# Patient Record
Sex: Female | Born: 1955 | Race: White | Hispanic: No | Marital: Married | Smoking: Former smoker
Health system: Southern US, Community
[De-identification: ages and names within clinical notes are randomized; demographics above are authoritative.]

## PROBLEM LIST (undated history)

## (undated) DIAGNOSIS — E785 Hyperlipidemia, unspecified: Secondary | ICD-10-CM

## (undated) DIAGNOSIS — I10 Essential (primary) hypertension: Secondary | ICD-10-CM

## (undated) DIAGNOSIS — K219 Gastro-esophageal reflux disease without esophagitis: Secondary | ICD-10-CM

## (undated) HISTORY — DX: Hyperlipidemia, unspecified: E78.5

## (undated) HISTORY — PX: ABDOMINAL HYSTERECTOMY: SHX81

## (undated) HISTORY — PX: TONSILLECTOMY AND ADENOIDECTOMY: SHX28

## (undated) HISTORY — DX: Essential (primary) hypertension: I10

## (undated) HISTORY — DX: Gastro-esophageal reflux disease without esophagitis: K21.9

---

## 2005-08-19 ENCOUNTER — Ambulatory Visit: Payer: Self-pay | Admitting: Cardiology

## 2019-04-05 DIAGNOSIS — Z0001 Encounter for general adult medical examination with abnormal findings: Secondary | ICD-10-CM | POA: Diagnosis not present

## 2019-04-05 DIAGNOSIS — R300 Dysuria: Secondary | ICD-10-CM | POA: Diagnosis not present

## 2019-04-10 DIAGNOSIS — N951 Menopausal and female climacteric states: Secondary | ICD-10-CM | POA: Diagnosis not present

## 2019-04-10 DIAGNOSIS — Z23 Encounter for immunization: Secondary | ICD-10-CM | POA: Diagnosis not present

## 2019-04-10 DIAGNOSIS — Z6828 Body mass index (BMI) 28.0-28.9, adult: Secondary | ICD-10-CM | POA: Diagnosis not present

## 2019-04-10 DIAGNOSIS — I10 Essential (primary) hypertension: Secondary | ICD-10-CM | POA: Diagnosis not present

## 2019-04-10 DIAGNOSIS — R0600 Dyspnea, unspecified: Secondary | ICD-10-CM | POA: Diagnosis not present

## 2019-04-10 DIAGNOSIS — Z0000 Encounter for general adult medical examination without abnormal findings: Secondary | ICD-10-CM | POA: Diagnosis not present

## 2019-04-10 DIAGNOSIS — K219 Gastro-esophageal reflux disease without esophagitis: Secondary | ICD-10-CM | POA: Diagnosis not present

## 2019-05-07 ENCOUNTER — Other Ambulatory Visit: Payer: Self-pay

## 2019-05-07 DIAGNOSIS — Z20822 Contact with and (suspected) exposure to covid-19: Secondary | ICD-10-CM

## 2019-05-11 LAB — NOVEL CORONAVIRUS, NAA: SARS-CoV-2, NAA: DETECTED — AB

## 2019-06-12 ENCOUNTER — Other Ambulatory Visit: Payer: Self-pay

## 2019-12-13 DIAGNOSIS — E7849 Other hyperlipidemia: Secondary | ICD-10-CM | POA: Diagnosis not present

## 2019-12-13 DIAGNOSIS — I10 Essential (primary) hypertension: Secondary | ICD-10-CM | POA: Diagnosis not present

## 2019-12-20 DIAGNOSIS — M170 Bilateral primary osteoarthritis of knee: Secondary | ICD-10-CM | POA: Diagnosis not present

## 2019-12-20 DIAGNOSIS — M1712 Unilateral primary osteoarthritis, left knee: Secondary | ICD-10-CM | POA: Diagnosis not present

## 2019-12-20 DIAGNOSIS — M25562 Pain in left knee: Secondary | ICD-10-CM | POA: Diagnosis not present

## 2019-12-20 DIAGNOSIS — M1711 Unilateral primary osteoarthritis, right knee: Secondary | ICD-10-CM | POA: Diagnosis not present

## 2019-12-20 DIAGNOSIS — M25561 Pain in right knee: Secondary | ICD-10-CM | POA: Diagnosis not present

## 2020-02-12 DIAGNOSIS — E7849 Other hyperlipidemia: Secondary | ICD-10-CM | POA: Diagnosis not present

## 2020-02-12 DIAGNOSIS — I10 Essential (primary) hypertension: Secondary | ICD-10-CM | POA: Diagnosis not present

## 2020-03-13 DIAGNOSIS — K219 Gastro-esophageal reflux disease without esophagitis: Secondary | ICD-10-CM | POA: Diagnosis not present

## 2020-03-13 DIAGNOSIS — I10 Essential (primary) hypertension: Secondary | ICD-10-CM | POA: Diagnosis not present

## 2020-05-13 DIAGNOSIS — K219 Gastro-esophageal reflux disease without esophagitis: Secondary | ICD-10-CM | POA: Diagnosis not present

## 2020-05-13 DIAGNOSIS — R600 Localized edema: Secondary | ICD-10-CM | POA: Diagnosis not present

## 2020-05-13 DIAGNOSIS — I10 Essential (primary) hypertension: Secondary | ICD-10-CM | POA: Diagnosis not present

## 2020-06-15 DIAGNOSIS — Z87891 Personal history of nicotine dependence: Secondary | ICD-10-CM | POA: Diagnosis not present

## 2020-06-15 DIAGNOSIS — R0600 Dyspnea, unspecified: Secondary | ICD-10-CM | POA: Diagnosis not present

## 2020-06-15 DIAGNOSIS — Z6829 Body mass index (BMI) 29.0-29.9, adult: Secondary | ICD-10-CM | POA: Diagnosis not present

## 2020-06-23 DIAGNOSIS — Z20828 Contact with and (suspected) exposure to other viral communicable diseases: Secondary | ICD-10-CM | POA: Diagnosis not present

## 2020-07-07 ENCOUNTER — Other Ambulatory Visit: Payer: Self-pay

## 2020-07-07 ENCOUNTER — Encounter: Payer: Self-pay | Admitting: Internal Medicine

## 2020-07-07 ENCOUNTER — Other Ambulatory Visit (HOSPITAL_COMMUNITY)
Admission: RE | Admit: 2020-07-07 | Discharge: 2020-07-07 | Disposition: A | Discharge status: Home / Self Care | Payer: PPO | Source: Ambulatory Visit | Attending: Internal Medicine | Admitting: Internal Medicine

## 2020-07-07 ENCOUNTER — Ambulatory Visit (HOSPITAL_COMMUNITY)
Admission: RE | Admit: 2020-07-07 | Discharge: 2020-07-07 | Disposition: A | Discharge status: Home / Self Care | Payer: PPO | Source: Ambulatory Visit | Attending: Internal Medicine | Admitting: Internal Medicine

## 2020-07-07 ENCOUNTER — Ambulatory Visit: Payer: PPO | Admitting: Internal Medicine

## 2020-07-07 DIAGNOSIS — R0600 Dyspnea, unspecified: Secondary | ICD-10-CM | POA: Insufficient documentation

## 2020-07-07 DIAGNOSIS — R0609 Other forms of dyspnea: Secondary | ICD-10-CM

## 2020-07-07 DIAGNOSIS — R0602 Shortness of breath: Secondary | ICD-10-CM | POA: Diagnosis not present

## 2020-07-07 DIAGNOSIS — K449 Diaphragmatic hernia without obstruction or gangrene: Secondary | ICD-10-CM | POA: Diagnosis not present

## 2020-07-07 LAB — CBC WITH DIFFERENTIAL/PLATELET
Abs Immature Granulocytes: 0.02 10*3/uL (ref 0.00–0.07)
Basophils Absolute: 0.1 10*3/uL (ref 0.0–0.1)
Basophils Relative: 1 %
Eosinophils Absolute: 0.2 10*3/uL (ref 0.0–0.5)
Eosinophils Relative: 3 %
HCT: 41.6 % (ref 36.0–46.0)
Hemoglobin: 13.4 g/dL (ref 12.0–15.0)
Immature Granulocytes: 0 %
Lymphocytes Relative: 28 %
Lymphs Abs: 1.6 10*3/uL (ref 0.7–4.0)
MCH: 32.1 pg (ref 26.0–34.0)
MCHC: 32.2 g/dL (ref 30.0–36.0)
MCV: 99.8 fL (ref 80.0–100.0)
Monocytes Absolute: 0.7 10*3/uL (ref 0.1–1.0)
Monocytes Relative: 12 %
Neutro Abs: 3.1 10*3/uL (ref 1.7–7.7)
Neutrophils Relative %: 56 %
Platelets: 278 10*3/uL (ref 150–400)
RBC: 4.17 MIL/uL (ref 3.87–5.11)
RDW: 12.1 % (ref 11.5–15.5)
WBC: 5.6 10*3/uL (ref 4.0–10.5)
nRBC: 0 % (ref 0.0–0.2)

## 2020-07-07 LAB — BASIC METABOLIC PANEL
Anion gap: 9 (ref 5–15)
BUN: 17 mg/dL (ref 8–23)
CO2: 27 mmol/L (ref 22–32)
Calcium: 9.3 mg/dL (ref 8.9–10.3)
Chloride: 101 mmol/L (ref 98–111)
Creatinine, Ser: 1 mg/dL (ref 0.44–1.00)
GFR calc Af Amer: >60 mL/min (ref >60)
GFR calc non Af Amer: 59 mL/min — ABNORMAL LOW (ref >60)
Glucose, Bld: 105 mg/dL — ABNORMAL HIGH (ref 70–99)
Potassium: 4.2 mmol/L (ref 3.5–5.1)
Sodium: 137 mmol/L (ref 135–145)

## 2020-07-07 LAB — D-DIMER, QUANTITATIVE: D-Dimer, Quant: 0.87 ug/mL-FEU — ABNORMAL HIGH (ref 0.00–0.50)

## 2020-07-07 LAB — TSH: TSH: 2.007 u[IU]/mL (ref 0.350–4.500)

## 2020-07-07 LAB — BRAIN NATRIURETIC PEPTIDE: B Natriuretic Peptide: 99 pg/mL (ref 0.0–100.0)

## 2020-07-07 MED ORDER — PANTOPRAZOLE SODIUM 40 MG PO TBEC
DELAYED_RELEASE_TABLET | ORAL | 11 refills | Status: DC
Start: 2020-07-07 — End: 2022-10-05

## 2020-07-07 NOTE — Assessment & Plan Note (Addendum)
Onset 2019 assoc with 15 lb wt gain/ still playing pickle ball as of 07/07/2020  With overt gerd / noct cough  - 07/07/2020 rec max gerd rx/ reconditioning by submax ex then proceed to cpst   Symptoms are  disproportionate to objective findings and not clear to what extent this is actually a pulmonary  problem but pt does appear to have difficult to sort out respiratory symptoms of unknown origin for which  DDX  = almost all start with A and  include Adherence, Ace Inhibitors, Acid Reflux, Active Sinus Disease, Alpha 1 Antitripsin deficiency, Anxiety masquerading as Airways dz,  ABPA,  Allergy(esp in young), Aspiration (esp in elderly), Adverse effects of meds,  Active smoking or Vaping, A bunch of PE's/clot burden (a few small clots can't cause this syndrome unless there is already severe underlying pulm or vascular dz with poor reserve),  Anemia or thyroid disorder, plus two Bs  = Bronchiectasis and Beta blocker use..and one C= CHF    Adherence is always the initial "prime suspect" and is a multilayered concern that requires a "trust but verify" approach in every patient - starting with knowing how to use medications, especially inhalers, correctly, keeping up with refills and understanding the fundamental difference between maintenance and prns vs those medications only taken for a very short course and then stopped and not refilled.   ? Acid (or non-acid) GERD > always difficult to exclude as up to 75% of pts in some series report no assoc GI/ Heartburn symptoms> rec max (24h)  acid suppression and diet restrictions/ reviewed and instructions given in writing.   ? Allergy / asthma > check allergy profile, hold off bronchodilators until after cpst at which time will get before and after spriometry to r/o EIA   ? Adverse drug effects > none of the usual suspects listed  ? ABPA > check eos/ IgE  ? Alpha one AT > check level  ? Anemia/ thyroid dz> ruled out today  ? A bunch of PEs>  D dimer high   nl -  high normal value (seen commonly in the elderly or chronically ill)  may miss small peripheral pe, the clot burden with sob is moderately high and the d dimer  has a very high neg pred value if used in this setting.    ? Beta blocker effects > unlikely  on such low doses lopressor  ? chf > excluded by cxr/ bnp    >>> will focus on rx of reflux / advise on reconditioning and then cpst next if not making progress and in meantime advised: Make sure you check your oxygen saturations at highest level of activity to be sure it stays over 90% and keep track of it at least once a week, more often if breathing getting worse, and let me know if losing ground.           Each maintenance medication was reviewed in detail including emphasizing most importantly the difference between maintenance and prns and under what circumstances the prns are to be triggered using an action plan format where appropriate.  Total time for H and P, chart review, counseling,  and generating customized AVS unique to this office visit / charting = 68 min

## 2020-07-07 NOTE — Patient Instructions (Addendum)
To get the most out of exercise, you need to be continuously aware that you are short of breath, but never out of breath, for 30 minutes daily. As you improve, it will actually be easier for you to do the same amount of exercise  in  30 minutes so always push to the level where you are short of breath.  Make sure you check your oxygen saturations at highest level of activity to be sure it stays over 90% and keep track of it at least once a week, more often if breathing getting worse, and let me know if losing ground.   Protonix 40 mg Take 30- 60 min before your first and last meals of the day    GERD (REFLUX)  is an extremely common cause of respiratory symptoms just like yours , many times with no obvious heartburn at all.    It can be treated with medication, but also with lifestyle changes including elevation of the head of your bed (ideally with 6 -8inch blocks under the headboard of your bed),  Smoking cessation, avoidance of late meals, excessive alcohol, and avoid fatty foods, chocolate, peppermint, colas, red wine, and acidic juices such as orange juice.  NO MINT OR MENTHOL PRODUCTS SO NO COUGH DROPS  USE SUGARLESS CANDY INSTEAD (Jolley ranchers or Stover's or Life Savers) or even ice chips will also do - the key is to swallow to prevent all throat clearing. NO OIL BASED VITAMINS - use powdered substitutes.  Avoid fish oil when coughing.   Please remember to go to the lab and x-ray department at [facility]   for your tests - we will call you with the results when they are available.   If not better after several weeks doing the above then  call me to schedule CPST.       Marland Kitchen

## 2020-07-07 NOTE — Progress Notes (Signed)
Kelly Valentine, female    DOB: September 06, 1956, 64 y.o.   MRN: [id number]   Brief patient profile:  89 yowf  Quit smoking 2006 did great with IUP's and good ex tolerance until 2019 rx with prednisone for R hip pain, resolved p epidural but this time period developed doe   assoc with wt gain from a baseline 165 and downhill since so referred to pulmonary clinic in [facility]  07/07/2020 by Alyssa Allwardt.   W/u :  Stress test on treadmill 2019 by [facility] reportedly neg for cardiac cause for doe     History of Present Illness  07/07/2020  Pulmonary/ 1st office eval/ Kelly Valentine / [facility]  Chief Complaint  Patient presents with  . Pulmonary Consult    Referred by Ila Mcgill, PA.  Pt c/o SOB for the past 2 years- progressively getting worse. She states SOB is "kind of constant"- worse with exertion such as carrying her grandson.   Dyspnea:  Can't do [employer] fast x 3  /  One flight steps and stops at top  Still pickle ball and dancing  Cough: variably esp at hs dry hack at bedtime but does not keep her  Sleep: one pillow / bed is flat  SABA use: none Has overt hb despite ppi in am and pepcid in pm   No obvious day to day or daytime variability or assoc excess/ purulent sputum or mucus plugs or hemoptysis or cp or chest tightness, subjective wheeze or overt sinus  symptoms.   Sleeping once she gets to sleep  without nocturnal  or early am exacerbation  of respiratory  c/o's or need for noct saba. Also denies any obvious fluctuation of symptoms with weather or environmental changes or other aggravating or alleviating factors except as outlined above   No unusual exposure hx or h/o childhood pna/ asthma or knowledge of premature birth.  Current Allergies, Complete Past Medical History, Past Surgical History, Family History, and Social History were reviewed in [employer] record.  ROS  The following are not active complaints unless bolded Hoarseness,  sore throat, dysphagia, dental problems, itching, sneezing,  nasal congestion or discharge of excess mucus or purulent secretions, ear ache,   fever, chills, sweats, unintended wt loss or wt gain, classically pleuritic or exertional cp,  orthopnea pnd or arm/hand swelling  or leg swelling, presyncope, palpitations, abdominal pain, anorexia, nausea, vomiting, diarrhea  or change in bowel habits or change in bladder habits, change in stools or change in urine, dysuria, hematuria,  rash, arthralgias, visual complaints, headache, numbness, weakness or ataxia or problems with walking or coordination,  change in mood or  memory.           No past medical history on file.  Outpatient Medications Prior to Visit  Medication Sig Dispense Refill  . metoprolol tartrate (LOPRESSOR) 50 MG tablet Take 50 mg by mouth daily.    . pantoprazole (PROTONIX) 40 MG tablet Take 40 mg by mouth daily.         Objective:     BP (!) 148/90 (BP Location: Left Arm, Cuff Size: Normal)   Pulse 66   Temp 97.8 F (36.6 C) (Temporal)   Ht 5\' 7"  (1.702 m)   Wt 181 lb (82.1 kg)   SpO2 97% Comment: on RA  BMI 28.35 kg/m   SpO2: 97 % (on RA)   amb pleasant wf nad    HEENT : pt wearing mask not removed for exam due  to covid -19 concerns.    NECK :  without JVD/Nodes/TM/ nl carotid upstrokes bilaterally   LUNGS: no acc muscle use,  Mild  contour chest which is clear to A and P bilaterally without cough on insp or exp maneuvers   CV:  RRR  no s3 or murmur or increase in P2, and no edema   ABD:  soft and nontender with nl inspiratory excursion in the supine position. No bruits or organomegaly appreciated, bowel sounds nl  MS:  Nl gait/ ext warm without deformities, calf tenderness, cyanosis or clubbing No obvious joint restrictions   SKIN: warm and dry without lesions    NEURO:  alert, approp, nl sensorium with  no motor or cerebellar deficits apparent.    CXR PA and Lateral:   07/07/2020 :    I personally  reviewed images and   impression as follows:    very mild copd/ mild kyphosis/ HH   Labs ordered 07/07/2020  :  allergy profile   alpha one AT phenotype    Labs ordered/ reviewed:      Chemistry      Component Value Date/Time   NA 137 07/07/2020 1148   K 4.2 07/07/2020 1148   CL 101 07/07/2020 1148   CO2 27 07/07/2020 1148   BUN 17 07/07/2020 1148   CREATININE 1.00 07/07/2020 1148      Component Value Date/Time   CALCIUM 9.3 07/07/2020 1148        Lab Results  Component Value Date   WBC 5.6 07/07/2020   HGB 13.4 07/07/2020   HCT 41.6 07/07/2020   MCV 99.8 07/07/2020   PLT 278 07/07/2020       EOS                                                               0.2                                   07/07/2020   Lab Results  Component Value Date   DDIMER 0.87 (H) 07/07/2020      Lab Results  Component Value Date   TSH 2.007 07/07/2020      BNP   07/07/2020   =  99            Assessment   DOE (dyspnea on exertion) Onset 2019 assoc with 15 lb wt gain/ still playing pickle ball as of 07/07/2020  With overt gerd / noct cough  - 07/07/2020 rec max gerd rx/ reconditioning by submax ex then proceed to cpst   Symptoms are  disproportionate to objective findings and not clear to what extent this is actually a pulmonary  problem but pt does appear to have difficult to sort out respiratory symptoms of unknown origin for which  DDX  = almost all start with A and  include Adherence, Ace Inhibitors, Acid Reflux, Active Sinus Disease, Alpha 1 Antitripsin deficiency, Anxiety masquerading as Airways dz,  ABPA,  Allergy(esp in young), Aspiration (esp in elderly), Adverse effects of meds,  Active smoking or Vaping, A bunch of PE's/clot burden (a few small clots can't cause this syndrome unless there is already severe underlying pulm or vascular dz with poor  reserve),  Anemia or thyroid disorder, plus two Bs  = Bronchiectasis and Beta blocker use..and one C= CHF    Adherence is always  the initial "prime suspect" and is a multilayered concern that requires a "trust but verify" approach in every patient - starting with knowing how to use medications, especially inhalers, correctly, keeping up with refills and understanding the fundamental difference between maintenance and prns vs those medications only taken for a very short course and then stopped and not refilled.   ? Acid (or non-acid) GERD > always difficult to exclude as up to 75% of pts in some series report no assoc GI/ Heartburn symptoms> rec max (24h)  acid suppression and diet restrictions/ reviewed and instructions given in writing.   ? Allergy / asthma > check allergy profile, hold off bronchodilators until after cpst at which time will get before and after spriometry to r/o EIA   ? Adverse drug effects > none of the usual suspects listed  ? ABPA > check eos/ IgE  ? Alpha one AT > check level  ? Anemia/ thyroid dz> ruled out today  ? A bunch of PEs>  D dimer high  nl -  high normal value (seen commonly in the elderly or chronically ill)  may miss small peripheral pe, the clot burden with sob is moderately high and the d dimer  has a very high neg pred value if used in this setting.    ? Beta blocker effects > unlikely  on such low doses lopressor  ? chf > excluded by cxr/ bnp    >>> will focus on rx of reflux / advise on reconditioning and then cpst next if not making progress and in meantime advised: Make sure you check your oxygen saturations at highest level of activity to be sure it stays over 90% and keep track of it at least once a week, more often if breathing getting worse, and let me know if losing ground.           Each maintenance medication was reviewed in detail including emphasizing most importantly the difference between maintenance and prns and under what circumstances the prns are to be triggered using an action plan format where appropriate.  Total time for H and P, chart review,  counseling,  and generating customized AVS unique to this office visit / charting = 68 min              Sandrea Hughs, MD 07/07/2020

## 2020-07-08 ENCOUNTER — Encounter: Payer: Self-pay | Admitting: Internal Medicine

## 2020-07-08 NOTE — Progress Notes (Signed)
Spoke with pt and notified of results per Dr. Wert. Pt verbalized understanding and denied any questions. 

## 2020-07-09 LAB — ALPHA-1 ANTITRYPSIN PHENOTYPE: A-1 Antitrypsin, Ser: 133 mg/dL (ref 101–187)

## 2020-07-14 DIAGNOSIS — K219 Gastro-esophageal reflux disease without esophagitis: Secondary | ICD-10-CM | POA: Diagnosis not present

## 2020-07-14 DIAGNOSIS — R600 Localized edema: Secondary | ICD-10-CM | POA: Diagnosis not present

## 2020-07-14 DIAGNOSIS — I10 Essential (primary) hypertension: Secondary | ICD-10-CM | POA: Diagnosis not present

## 2020-08-05 ENCOUNTER — Telehealth: Payer: Self-pay | Admitting: Internal Medicine

## 2020-08-05 NOTE — Telephone Encounter (Signed)
Spoke with the pt  She is aware of results  She will call back after her vacation if wants to proceed with stress test  She states wants to continue ov recs longer to see if she improves

## 2020-08-05 NOTE — Telephone Encounter (Signed)
Sorry for the delay but they came back piecemeal and I hadn't realized the last one had finally arrived - they are all nl so if not feeling better will need to consider cpst next

## 2020-08-05 NOTE — Telephone Encounter (Signed)
Called and spoke with patient, she is asking for lab results from 07/07/20.  She states she received a call about her cxr, but not about her lab work.  I let her know that I would have to get Dr. Sherene Sires to review the labs and then we would give her a call back.  She verbalized understanding. Dr. Sherene Sires, please advise on patients labs from 07/07/2020.  Thank you.

## 2020-08-11 DIAGNOSIS — K219 Gastro-esophageal reflux disease without esophagitis: Secondary | ICD-10-CM | POA: Diagnosis not present

## 2020-08-11 DIAGNOSIS — Z13220 Encounter for screening for lipoid disorders: Secondary | ICD-10-CM | POA: Diagnosis not present

## 2020-08-11 DIAGNOSIS — Z0001 Encounter for general adult medical examination with abnormal findings: Secondary | ICD-10-CM | POA: Diagnosis not present

## 2020-08-11 DIAGNOSIS — I10 Essential (primary) hypertension: Secondary | ICD-10-CM | POA: Diagnosis not present

## 2020-08-13 DIAGNOSIS — Z6829 Body mass index (BMI) 29.0-29.9, adult: Secondary | ICD-10-CM | POA: Diagnosis not present

## 2020-08-13 DIAGNOSIS — N951 Menopausal and female climacteric states: Secondary | ICD-10-CM | POA: Diagnosis not present

## 2020-08-13 DIAGNOSIS — K219 Gastro-esophageal reflux disease without esophagitis: Secondary | ICD-10-CM | POA: Diagnosis not present

## 2020-08-13 DIAGNOSIS — Z0000 Encounter for general adult medical examination without abnormal findings: Secondary | ICD-10-CM | POA: Diagnosis not present

## 2020-08-13 DIAGNOSIS — M1710 Unilateral primary osteoarthritis, unspecified knee: Secondary | ICD-10-CM | POA: Diagnosis not present

## 2020-08-13 DIAGNOSIS — I10 Essential (primary) hypertension: Secondary | ICD-10-CM | POA: Diagnosis not present

## 2020-08-13 DIAGNOSIS — Z23 Encounter for immunization: Secondary | ICD-10-CM | POA: Diagnosis not present

## 2020-09-12 DIAGNOSIS — K219 Gastro-esophageal reflux disease without esophagitis: Secondary | ICD-10-CM | POA: Diagnosis not present

## 2020-09-12 DIAGNOSIS — R600 Localized edema: Secondary | ICD-10-CM | POA: Diagnosis not present

## 2020-09-12 DIAGNOSIS — I10 Essential (primary) hypertension: Secondary | ICD-10-CM | POA: Diagnosis not present

## 2020-10-13 DIAGNOSIS — I10 Essential (primary) hypertension: Secondary | ICD-10-CM | POA: Diagnosis not present

## 2020-10-13 DIAGNOSIS — K219 Gastro-esophageal reflux disease without esophagitis: Secondary | ICD-10-CM | POA: Diagnosis not present

## 2020-10-13 DIAGNOSIS — R600 Localized edema: Secondary | ICD-10-CM | POA: Diagnosis not present

## 2020-11-13 DIAGNOSIS — R600 Localized edema: Secondary | ICD-10-CM | POA: Diagnosis not present

## 2020-11-13 DIAGNOSIS — I10 Essential (primary) hypertension: Secondary | ICD-10-CM | POA: Diagnosis not present

## 2020-11-13 DIAGNOSIS — K219 Gastro-esophageal reflux disease without esophagitis: Secondary | ICD-10-CM | POA: Diagnosis not present

## 2020-12-10 IMAGING — DX DG CHEST 2V
2 series · 2 of 2 positions shown · non-contrast
Comparison: None.

CLINICAL DATA: Shortness of breath.  Positive COVID test 05/11/2019

EXAM:
CHEST - 2 VIEW

[chest pa]
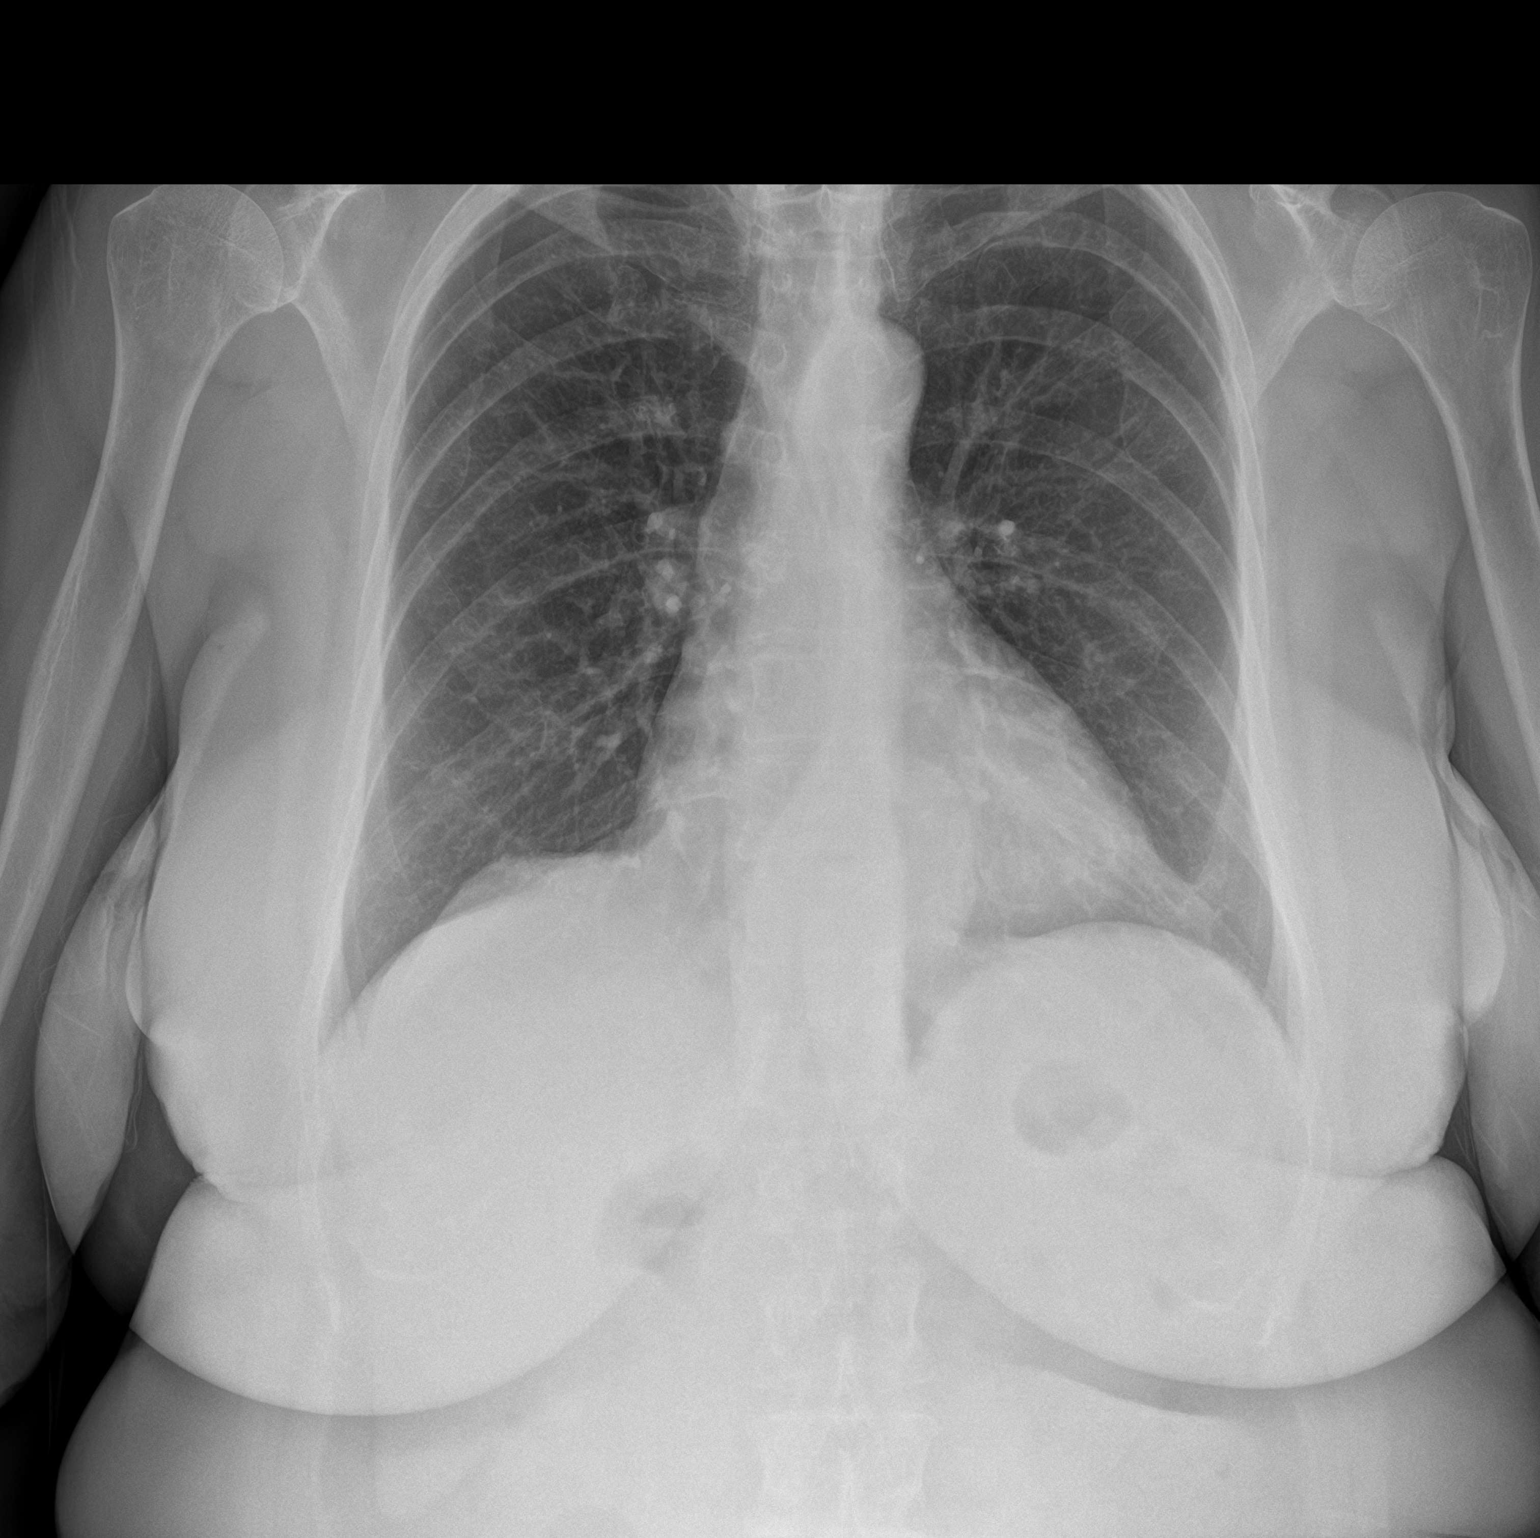

[chest lat]
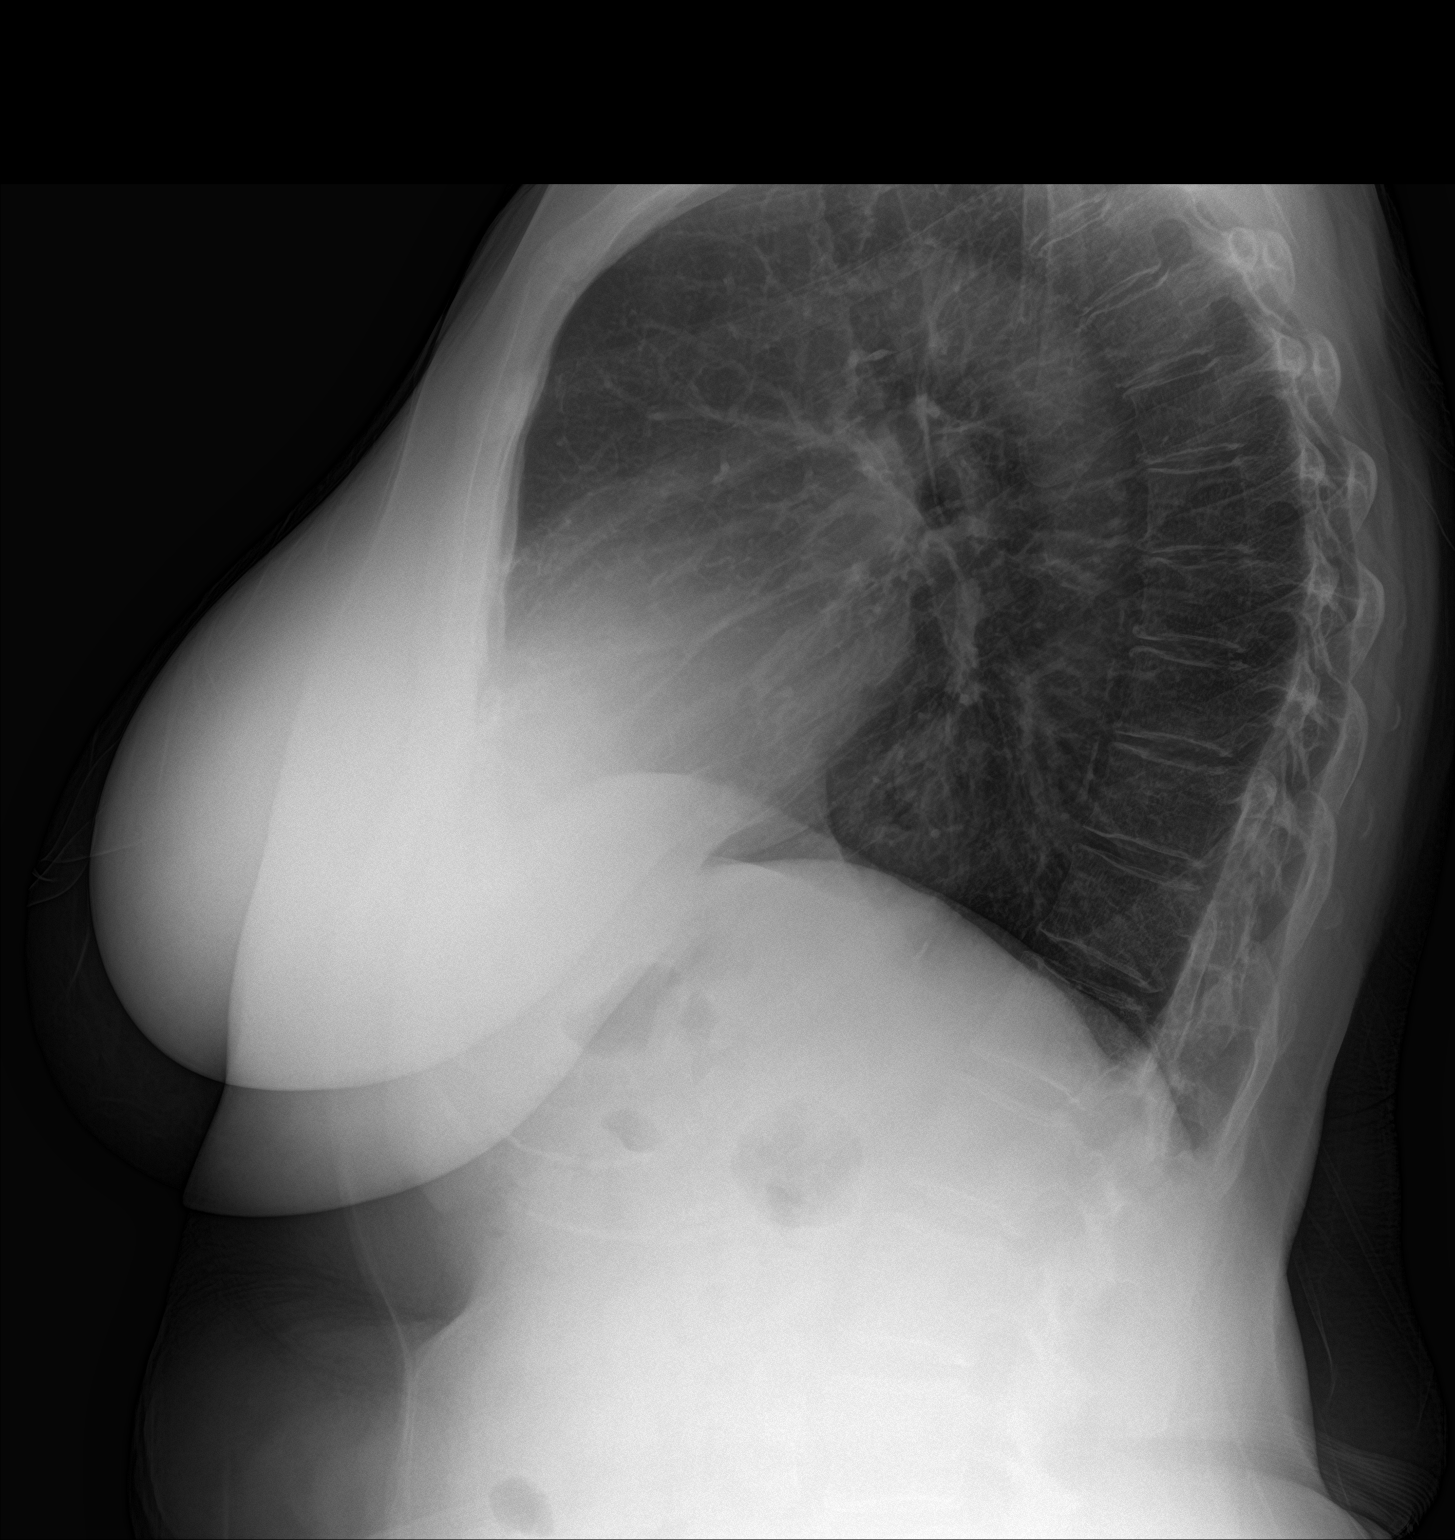

[2 of 2 positions shown; findings below may reference images not displayed]

FINDINGS: Small hiatal hernia is present. Heart size is. Lungs are clear. No
edema or effusion is present. Visualized soft tissues and bony
thorax are unremarkable.
IMPRESSION: 1. No acute cardiopulmonary disease.
2. Small hiatal hernia.

## 2020-12-12 DIAGNOSIS — K219 Gastro-esophageal reflux disease without esophagitis: Secondary | ICD-10-CM | POA: Diagnosis not present

## 2020-12-12 DIAGNOSIS — I10 Essential (primary) hypertension: Secondary | ICD-10-CM | POA: Diagnosis not present

## 2020-12-12 DIAGNOSIS — R600 Localized edema: Secondary | ICD-10-CM | POA: Diagnosis not present

## 2020-12-16 DIAGNOSIS — R922 Inconclusive mammogram: Secondary | ICD-10-CM | POA: Diagnosis not present

## 2020-12-16 DIAGNOSIS — N6324 Unspecified lump in the left breast, lower inner quadrant: Secondary | ICD-10-CM | POA: Diagnosis not present

## 2020-12-18 DIAGNOSIS — Z20828 Contact with and (suspected) exposure to other viral communicable diseases: Secondary | ICD-10-CM | POA: Diagnosis not present

## 2021-02-10 DIAGNOSIS — R600 Localized edema: Secondary | ICD-10-CM | POA: Diagnosis not present

## 2021-02-10 DIAGNOSIS — I10 Essential (primary) hypertension: Secondary | ICD-10-CM | POA: Diagnosis not present

## 2021-02-10 DIAGNOSIS — K219 Gastro-esophageal reflux disease without esophagitis: Secondary | ICD-10-CM | POA: Diagnosis not present

## 2021-02-22 DIAGNOSIS — Z13220 Encounter for screening for lipoid disorders: Secondary | ICD-10-CM | POA: Diagnosis not present

## 2021-02-23 DIAGNOSIS — K219 Gastro-esophageal reflux disease without esophagitis: Secondary | ICD-10-CM | POA: Diagnosis not present

## 2021-02-23 DIAGNOSIS — I10 Essential (primary) hypertension: Secondary | ICD-10-CM | POA: Diagnosis not present

## 2021-02-23 DIAGNOSIS — M1710 Unilateral primary osteoarthritis, unspecified knee: Secondary | ICD-10-CM | POA: Diagnosis not present

## 2021-02-23 DIAGNOSIS — E785 Hyperlipidemia, unspecified: Secondary | ICD-10-CM | POA: Diagnosis not present

## 2021-02-23 DIAGNOSIS — Z23 Encounter for immunization: Secondary | ICD-10-CM | POA: Diagnosis not present

## 2021-02-23 DIAGNOSIS — Z6829 Body mass index (BMI) 29.0-29.9, adult: Secondary | ICD-10-CM | POA: Diagnosis not present

## 2021-02-23 DIAGNOSIS — N951 Menopausal and female climacteric states: Secondary | ICD-10-CM | POA: Diagnosis not present

## 2021-02-23 DIAGNOSIS — N390 Urinary tract infection, site not specified: Secondary | ICD-10-CM | POA: Diagnosis not present

## 2021-02-23 DIAGNOSIS — Z0000 Encounter for general adult medical examination without abnormal findings: Secondary | ICD-10-CM | POA: Diagnosis not present

## 2021-04-12 DIAGNOSIS — K219 Gastro-esophageal reflux disease without esophagitis: Secondary | ICD-10-CM | POA: Diagnosis not present

## 2021-04-12 DIAGNOSIS — R600 Localized edema: Secondary | ICD-10-CM | POA: Diagnosis not present

## 2021-04-12 DIAGNOSIS — I10 Essential (primary) hypertension: Secondary | ICD-10-CM | POA: Diagnosis not present

## 2021-05-13 DIAGNOSIS — I10 Essential (primary) hypertension: Secondary | ICD-10-CM | POA: Diagnosis not present

## 2021-05-13 DIAGNOSIS — K219 Gastro-esophageal reflux disease without esophagitis: Secondary | ICD-10-CM | POA: Diagnosis not present

## 2021-05-13 DIAGNOSIS — R600 Localized edema: Secondary | ICD-10-CM | POA: Diagnosis not present

## 2021-09-22 DIAGNOSIS — Z13820 Encounter for screening for osteoporosis: Secondary | ICD-10-CM | POA: Diagnosis not present

## 2021-09-22 DIAGNOSIS — E785 Hyperlipidemia, unspecified: Secondary | ICD-10-CM | POA: Diagnosis not present

## 2021-09-22 DIAGNOSIS — I10 Essential (primary) hypertension: Secondary | ICD-10-CM | POA: Diagnosis not present

## 2021-09-22 DIAGNOSIS — Z0000 Encounter for general adult medical examination without abnormal findings: Secondary | ICD-10-CM | POA: Diagnosis not present

## 2021-09-22 DIAGNOSIS — Z23 Encounter for immunization: Secondary | ICD-10-CM | POA: Diagnosis not present

## 2022-10-05 ENCOUNTER — Ambulatory Visit: Payer: PPO | Attending: Internal Medicine | Admitting: Internal Medicine

## 2022-10-05 ENCOUNTER — Encounter: Payer: Self-pay | Admitting: Internal Medicine

## 2022-10-05 VITALS — BP 126/68 | HR 84 | Ht 66.0 in | Wt 177.0 lb

## 2022-10-05 DIAGNOSIS — E7849 Other hyperlipidemia: Secondary | ICD-10-CM

## 2022-10-05 DIAGNOSIS — Z8349 Family history of other endocrine, nutritional and metabolic diseases: Secondary | ICD-10-CM

## 2022-10-05 DIAGNOSIS — Z8679 Personal history of other diseases of the circulatory system: Secondary | ICD-10-CM

## 2022-10-05 DIAGNOSIS — E785 Hyperlipidemia, unspecified: Secondary | ICD-10-CM | POA: Insufficient documentation

## 2022-10-05 DIAGNOSIS — I10 Essential (primary) hypertension: Secondary | ICD-10-CM

## 2022-10-05 DIAGNOSIS — Z136 Encounter for screening for cardiovascular disorders: Secondary | ICD-10-CM

## 2022-10-05 NOTE — Progress Notes (Signed)
Cardiology Office Note  Date: 10/05/2022   ID: Kelly, Valentine [birth year], MRN [id number]  PCP:  Selinda Flavin, MD  Cardiologist:  Marjo Bicker, MD Electrophysiologist:  None   Reason for Office Visit: Chest tightness at the request of Dr. Mayford Knife   History of Present Illness: Kelly Valentine is a 66 y.o. female known to have HTN, HLD was referred to cardiology clinic for evaluation of chest tightness at the request of Dr. Mayford Knife.  Patient reported having 1 episode of chest tightness lasting for 1 second a few months ago.  She stated she had chest discomfort more than couple of years ago but it never recurred.  She can perform her daily activities with no symptoms. She plays pickle ball and while carrying the equipment, she felt a little out of breath. On another occasion, she suddenly felt choked and SOB. Denied any dizziness, palpitations, syncope, LE swelling.  She has a strong family history of CAD (father had PCI at unknown age and CABG in his 84s, her brother had PCI in his 81s). Denies smoking cigarettes, alcohol use or illicit drug abuse.  Past Medical History:  Diagnosis Date   GERD (gastroesophageal reflux disease)    Hyperlipidemia    Hypertension     Past Surgical History:  Procedure Laterality Date   ABDOMINAL HYSTERECTOMY     TONSILLECTOMY AND ADENOIDECTOMY      Current Outpatient Medications  Medication Sig Dispense Refill   losartan (COZAAR) 50 MG tablet Take 50 mg by mouth daily.     omeprazole (PRILOSEC) 40 MG capsule Take 40 mg by mouth daily.     pravastatin (PRAVACHOL) 40 MG tablet Take 40 mg by mouth daily.     No current facility-administered medications for this visit.   Allergies:  Patient has no allergy information on record.   Social History: The patient  reports that she quit smoking about 17 years ago. Her smoking use included cigarettes. She has a 20.00 pack-year smoking history. She has never used smokeless tobacco.    Family History: The patient's family history is not on file.   ROS:  Please see the history of present illness. Otherwise, complete review of systems is positive for none.  All other systems are reviewed and negative.   Physical Exam: VS:  Pulse 84   Ht 5\' 6"  (1.676 m)   Wt 177 lb (80.3 kg)   SpO2 98%   BMI 28.57 kg/m , BMI Body mass index is 28.57 kg/m.  Wt Readings from Last 3 Encounters:  10/05/22 177 lb (80.3 kg)  07/07/20 181 lb (82.1 kg)    General: Patient appears comfortable at rest. HEENT: Conjunctiva and lids normal, oropharynx clear with moist mucosa. Neck: Supple, no elevated JVP or carotid bruits, no thyromegaly. Lungs: Clear to auscultation, nonlabored breathing at rest. Cardiac: Regular rate and rhythm, no S3 or significant systolic murmur, no pericardial rub. Abdomen: Soft, nontender, no hepatomegaly, bowel sounds present, no guarding or rebound. Extremities: No pitting edema, distal pulses 2+. Skin: Warm and dry. Musculoskeletal: No kyphosis. Neuropsychiatric: Alert and oriented x3, affect grossly appropriate.  ECG:  An ECG dated 10/05/2022 was personally reviewed today and demonstrated:  Normal sinus rhythm and no ST-T changes  Recent Labwork: No results found for requested labs within last 365 days.  No results found for: "CHOL", "TRIG", "HDL", "CHOLHDL", "VLDL", "LDLCALC", "LDLDIRECT"  Other Studies Reviewed Today: I personally reviewed the heart care referral records from the PCP.  LDL was 93.  Assessment and Plan: Patient is a 66 year old F known to have HTN, HLD was referred to cardiology clinic for evaluation of chest tightness at the request of Dr. Mayford Knife.  # Screening for cardiovascular condition -Patient has atypical symptoms which are inconclusive for cardiac etiology at the moment.  She denied any angina or DOE. I will obtain CT calcium scoring of her coronary arteries due to strong family history of CAD.  # HTN, controlled -Continue  losartan 50 mg once daily -Management of HTN per PCP  # HLD, currently at goal -Continue pravastatin 40 mg nightly.  I have spent a total of 45 minutes with patient reviewing chart, EKGs, labs and examining patient as well as establishing an assessment and plan that was discussed with the patient.  > 50% of time was spent in direct patient care.     Medication Adjustments/Labs and Tests Ordered: Current medicines are reviewed at length with the patient today.  Concerns regarding medicines are outlined above.   Tests Ordered: No orders of the defined types were placed in this encounter.   Medication Changes: No orders of the defined types were placed in this encounter.   Disposition:  Follow up  3 months  Signed Charlene Detter Verne Spurr, MD, 10/05/2022 1:38 PM    [facility] at [facility] [address] [place], [place], [place] [address]

## 2022-10-05 NOTE — Patient Instructions (Addendum)
Medication Instructions:  Your physician recommends that you continue on your current medications as directed. Please refer to the Current Medication list given to you today.   Labwork: none  Testing/Procedures: Calcium Score (Self Pay)  Follow-Up:  Your physician recommends that you schedule a follow-up appointment in: 6 months  Any Other Special Instructions Will Be Listed Below (If Applicable).  If you need a refill on your cardiac medications before your next appointment, please call your pharmacy.

## 2022-11-04 ENCOUNTER — Other Ambulatory Visit (HOSPITAL_COMMUNITY): Payer: PPO

## 2022-12-08 ENCOUNTER — Ambulatory Visit (HOSPITAL_COMMUNITY)
Admission: RE | Admit: 2022-12-08 | Discharge: 2022-12-08 | Disposition: A | Discharge status: Home / Self Care | Payer: PPO | Source: Ambulatory Visit | Attending: Internal Medicine | Admitting: Internal Medicine

## 2022-12-08 DIAGNOSIS — Z8679 Personal history of other diseases of the circulatory system: Secondary | ICD-10-CM | POA: Insufficient documentation

## 2022-12-15 ENCOUNTER — Other Ambulatory Visit: Payer: Self-pay | Admitting: *Deleted

## 2022-12-15 MED ORDER — ASPIRIN 81 MG PO TBEC: 81.0000 mg | DELAYED_RELEASE_TABLET | Freq: Every day | ORAL | 3 refills | Status: AC

## 2023-04-27 ENCOUNTER — Ambulatory Visit: Payer: PPO | Admitting: Internal Medicine

## 2024-10-08 DIAGNOSIS — Z131 Encounter for screening for diabetes mellitus: Secondary | ICD-10-CM | POA: Diagnosis not present

## 2024-10-08 DIAGNOSIS — E559 Vitamin D deficiency, unspecified: Secondary | ICD-10-CM | POA: Diagnosis not present

## 2024-10-08 DIAGNOSIS — Z1329 Encounter for screening for other suspected endocrine disorder: Secondary | ICD-10-CM | POA: Diagnosis not present

## 2024-10-08 DIAGNOSIS — Z130 Encounter for screening for diseases of the blood and blood-forming organs and certain disorders involving the immune mechanism: Secondary | ICD-10-CM | POA: Diagnosis not present

## 2024-10-08 DIAGNOSIS — E785 Hyperlipidemia, unspecified: Secondary | ICD-10-CM | POA: Diagnosis not present

## 2024-10-15 DIAGNOSIS — I10 Essential (primary) hypertension: Secondary | ICD-10-CM | POA: Diagnosis not present

## 2024-10-15 DIAGNOSIS — Z23 Encounter for immunization: Secondary | ICD-10-CM | POA: Diagnosis not present

## 2024-10-15 DIAGNOSIS — K219 Gastro-esophageal reflux disease without esophagitis: Secondary | ICD-10-CM | POA: Diagnosis not present

## 2024-10-15 DIAGNOSIS — Z6827 Body mass index (BMI) 27.0-27.9, adult: Secondary | ICD-10-CM | POA: Diagnosis not present

## 2024-10-15 DIAGNOSIS — E559 Vitamin D deficiency, unspecified: Secondary | ICD-10-CM | POA: Diagnosis not present

## 2024-10-15 DIAGNOSIS — E785 Hyperlipidemia, unspecified: Secondary | ICD-10-CM | POA: Diagnosis not present

## 2024-10-15 DIAGNOSIS — Z2911 Encounter for prophylactic immunotherapy for respiratory syncytial virus (RSV): Secondary | ICD-10-CM | POA: Diagnosis not present

## 2024-10-15 DIAGNOSIS — R0600 Dyspnea, unspecified: Secondary | ICD-10-CM | POA: Diagnosis not present

## 2024-10-15 DIAGNOSIS — Z0001 Encounter for general adult medical examination with abnormal findings: Secondary | ICD-10-CM | POA: Diagnosis not present

## 2024-11-04 ENCOUNTER — Encounter: Payer: Self-pay | Admitting: Internal Medicine

## 2024-11-21 ENCOUNTER — Encounter: Payer: Self-pay | Admitting: Internal Medicine

## 2024-11-21 ENCOUNTER — Ambulatory Visit (INDEPENDENT_AMBULATORY_CARE_PROVIDER_SITE_OTHER): Admitting: Internal Medicine

## 2024-11-21 VITALS — BP 151/81 | HR 77 | Temp 98.6°F | Ht 66.0 in | Wt 167.1 lb

## 2024-11-21 DIAGNOSIS — K449 Diaphragmatic hernia without obstruction or gangrene: Secondary | ICD-10-CM | POA: Diagnosis not present

## 2024-11-21 DIAGNOSIS — K219 Gastro-esophageal reflux disease without esophagitis: Secondary | ICD-10-CM | POA: Diagnosis not present

## 2024-11-21 NOTE — Progress Notes (Signed)
 "   Primary Care Physician:  Trudy Vaughn FALCON, MD Primary Gastroenterologist:  Dr. Cindie  Chief Complaint  Patient presents with   New Patient (Initial Visit)    Patient here today as a new patient and has history of gerd, and says she has had several instances of where she wakes up in the middle of the night gasping for air. She has been told by pcp he would refer her to gi and to cardiology to make sure this is not gi or cardiac related. She has an appointment with Cardiology 12/28/2023. Patient is taking omeprazole 40 mg once per day. Patient denies any gerd symptoms.     HPI:   Kelly Valentine is a 69 y.o. female who presents to clinic today by referral from her PCP Dr. Trudy for evaluation.  She states over the period of 3 to 4 weeks she had multiple episodes of waking up in the [place] gasping for air.  Was referred to our office due to possible reflux playing a role in her symptoms.  She does endorse chronic acid reflux/heartburn.  Has been on omeprazole for multiple years with good control.  Denies any dysphagia odynophagia.  No epigastric or chest pain.  No history of sleep apnea.  Does endorse mild snoring at times.  She had a CT cardiac done on 12/08/2022 which I personally reviewed.  Moderate to large hiatal hernia noted.  Past Medical History:  Diagnosis Date   GERD (gastroesophageal reflux disease)    Hyperlipidemia    Hypertension     Past Surgical History:  Procedure Laterality Date   ABDOMINAL HYSTERECTOMY     TONSILLECTOMY AND ADENOIDECTOMY      Current Outpatient Medications  Medication Sig Dispense Refill   aspirin  EC 81 MG tablet Take 1 tablet (81 mg total) by mouth daily. Swallow whole. (Patient not taking: Reported on 11/21/2024) 90 tablet 3   calcium carbonate (OSCAL) 1500 (600 Ca) MG TABS tablet Take 600 mg of elemental calcium by mouth daily with breakfast.     Cholecalciferol (VITAMIN D) 125 MCG CAPS Take by mouth daily at 6 (six) AM.      losartan (COZAAR) 50 MG tablet Take 50 mg by mouth daily.     omeprazole (PRILOSEC) 40 MG capsule Take 40 mg by mouth daily.     OVER THE COUNTER MEDICATION Magnesium with D3 and zinc once per day.     pravastatin (PRAVACHOL) 20 MG tablet Take 20 mg by mouth daily. (Patient not taking: Reported on 11/21/2024)     No current facility-administered medications for this visit.    Allergies as of 11/21/2024 - Review Complete 11/21/2024  Allergen Reaction Noted   Naproxen Other (See Comments) 10/25/2017    Family History  Problem Relation Age of Onset   Diabetes Mother    Hypertension Mother    Asthma Mother    CAD Mother    CAD Father    CAD Brother    CAD Brother     Social History   Socioeconomic History   Marital status: Married    Spouse name: Not on file   Number of children: Not on file   Years of education: Not on file   Highest education level: Not on file  Occupational History   Not on file  Tobacco Use   Smoking status: Former    Current packs/day: 0.00    Average packs/day: 1 pack/day for 20.0 years (20.0 ttl pk-yrs)    Types: Cigarettes  Start date: 11/14/1984    Quit date: 11/14/2004    Years since quitting: 20.0   Smokeless tobacco: Never  Vaping Use   Vaping status: Never Used  Substance and Sexual Activity   Alcohol use: Not on file    Comment: socially   Drug use: Never   Sexual activity: Not on file  Other Topics Concern   Not on file  Social History Narrative   Not on file   Social Drivers of Health   Tobacco Use: Medium Risk (11/21/2024)   Patient History    Smoking Tobacco Use: Former    Smokeless Tobacco Use: Never    Passive Exposure: Not on Actuary Strain: Not on file  Food Insecurity: Not on file  Transportation Needs: Not on file  Physical Activity: Not on file  Stress: Not on file  Social Connections: Not on file  Intimate Partner Violence: Not on file  Depression (EYV7-0): Not on file  Alcohol Screen: Not on file   Housing: Not on file  Utilities: Not on file  Health Literacy: Not on file    Subjective: Review of Systems  Constitutional:  Negative for chills and fever.  HENT:  Negative for congestion and hearing loss.   Eyes:  Negative for blurred vision and double vision.  Respiratory:  Negative for cough and shortness of breath.   Cardiovascular:  Negative for chest pain and palpitations.  Gastrointestinal:  Positive for heartburn. Negative for abdominal pain, blood in stool, constipation, diarrhea, melena and vomiting.  Genitourinary:  Negative for dysuria and urgency.  Musculoskeletal:  Negative for joint pain and myalgias.  Skin:  Negative for itching and rash.  Neurological:  Negative for dizziness and headaches.  Psychiatric/Behavioral:  Negative for depression. The patient is not nervous/anxious.        Objective: BP (!) 151/81 (BP Location: Left Arm, Patient Position: Sitting, Cuff Size: Normal)   Pulse 77   Temp 98.6 F (37 C) (Temporal)   Ht 5' 6 (1.676 m)   Wt 167 lb 1.6 oz (75.8 kg)   BMI 26.97 kg/m  Physical Exam Constitutional:      Appearance: Normal appearance.  HENT:     Head: Normocephalic and atraumatic.  Eyes:     Extraocular Movements: Extraocular movements intact.     Conjunctiva/sclera: Conjunctivae normal.  Cardiovascular:     Rate and Rhythm: Normal rate and regular rhythm.  Pulmonary:     Effort: Pulmonary effort is normal.     Breath sounds: Normal breath sounds.  Abdominal:     General: Bowel sounds are normal.     Palpations: Abdomen is soft.  Musculoskeletal:        General: No swelling. Normal range of motion.     Cervical back: Normal range of motion and neck supple.  Skin:    General: Skin is warm and dry.     Coloration: Skin is not jaundiced.  Neurological:     General: No focal deficit present.     Mental Status: She is alert and oriented to person, place, and time.  Psychiatric:        Mood and Affect: Mood normal.         Behavior: Behavior normal.      Assessment/Plan:  1.  Chronic GERD, hiatal hernia-her acid reflux appears to be well-controlled at present on omeprazole daily.  Will continue.  Discussed her CT scan in depth with her today as well as her hiatal hernia.  If she has  resurgence of her nighttime symptoms which her hernia/reflux could be playing a role in, she will let us  know and we will schedule for upper endoscopy.  Otherwise continue to monitor.  Agree with sleep study to further evaluate for sleep apnea.  Follow-up in 3 to 4 months.  Thank you Dr. Trudy for the kind referral.   11/21/2024 9:06 AM    "

## 2024-11-21 NOTE — Patient Instructions (Signed)
 I think we can continue to watch her symptoms for now as they have improved.  Continue omeprazole daily.  If your symptoms return then let me know and we can schedule you for upper endoscopy to further evaluate.  I do think you would benefit from sleep study to evaluate for sleep apnea.  I will defer to Dr. Trudy in this regard.  Follow-up in 3 to 4 months.  It was very nice meeting you today.  Dr. Cindie

## 2024-12-27 ENCOUNTER — Ambulatory Visit: Admitting: Internal Medicine
